# Patient Record
Sex: Female | Born: 2000 | Hispanic: No | Marital: Single | Smoking: Former smoker
Health system: Southern US, Community
[De-identification: ages and names within clinical notes are randomized; demographics above are authoritative.]

---
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
## 2020-01-20 ENCOUNTER — Telehealth: Payer: Medicaid Other | Admitting: Internal Medicine

## 2020-07-03 IMAGING — CR LEFT KNEE - COMPLETE 4+ VIEW
4 series · 4 of 4 positions shown · non-contrast
Comparison: None.

CLINICAL DATA: Recent popping sensation in the knee, initial
encounter

EXAM:
LEFT KNEE - COMPLETE 4+ VIEW

[knee ap]
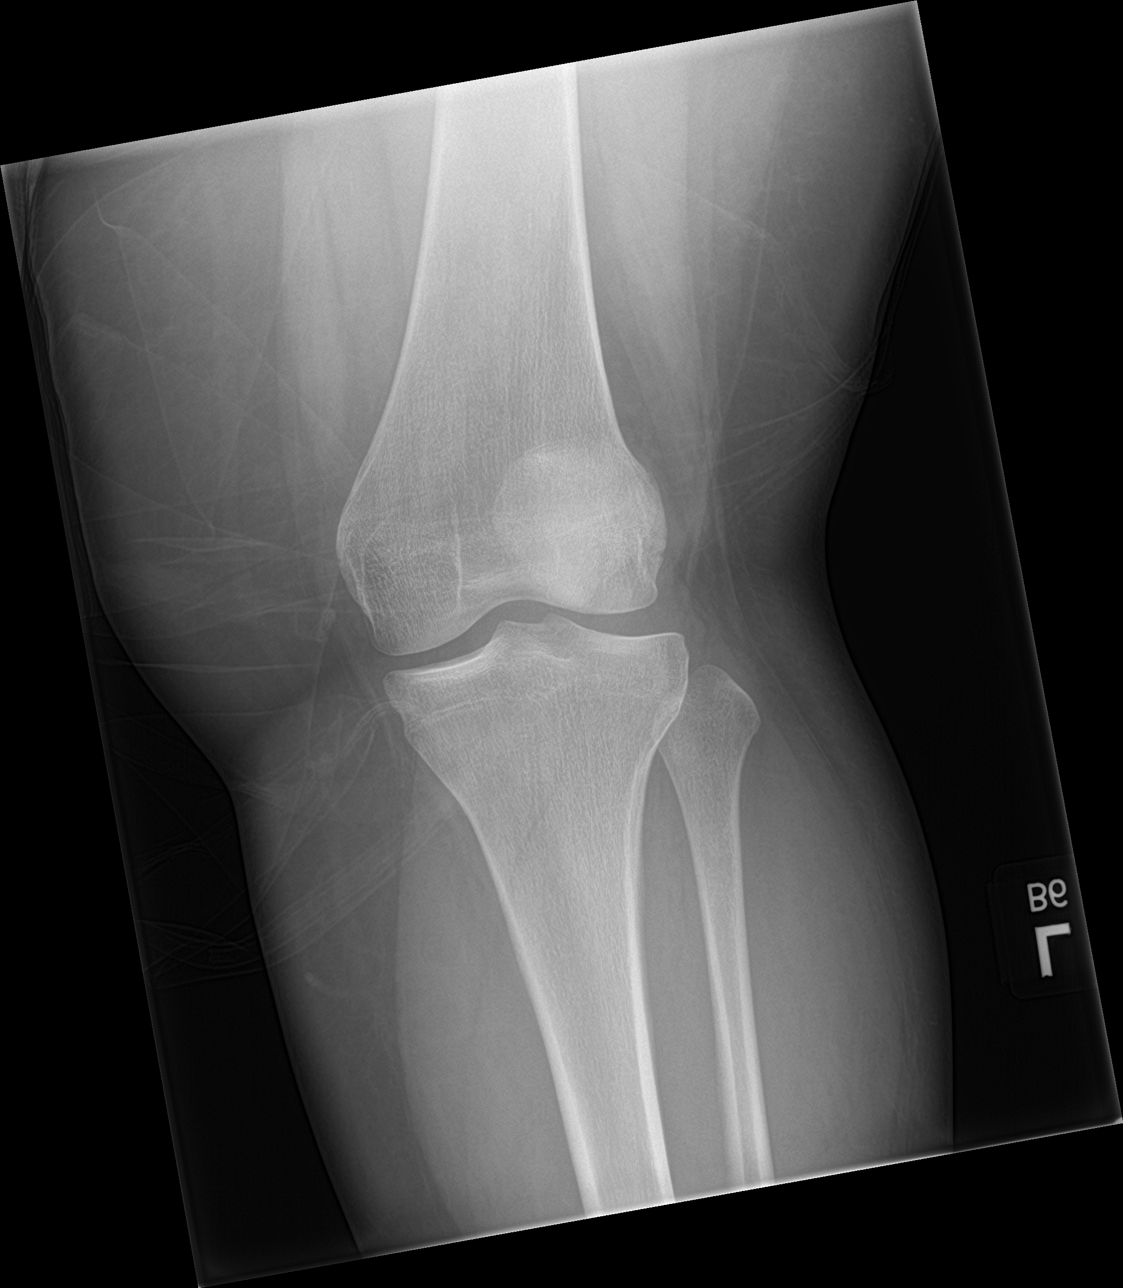

[knee obl]
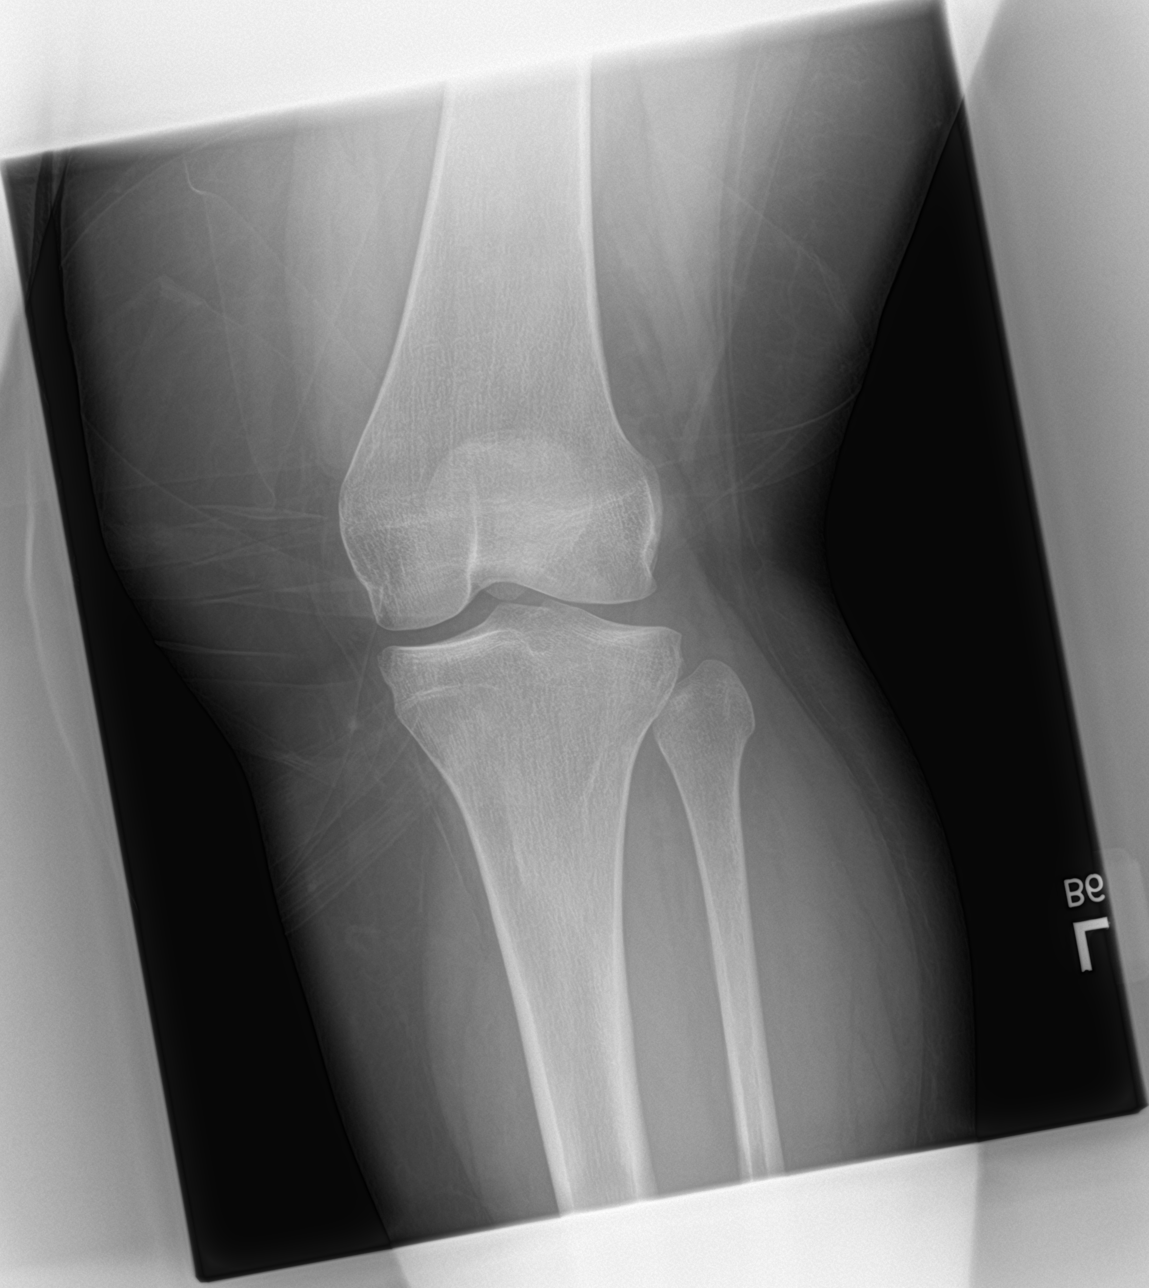

[knee lat]
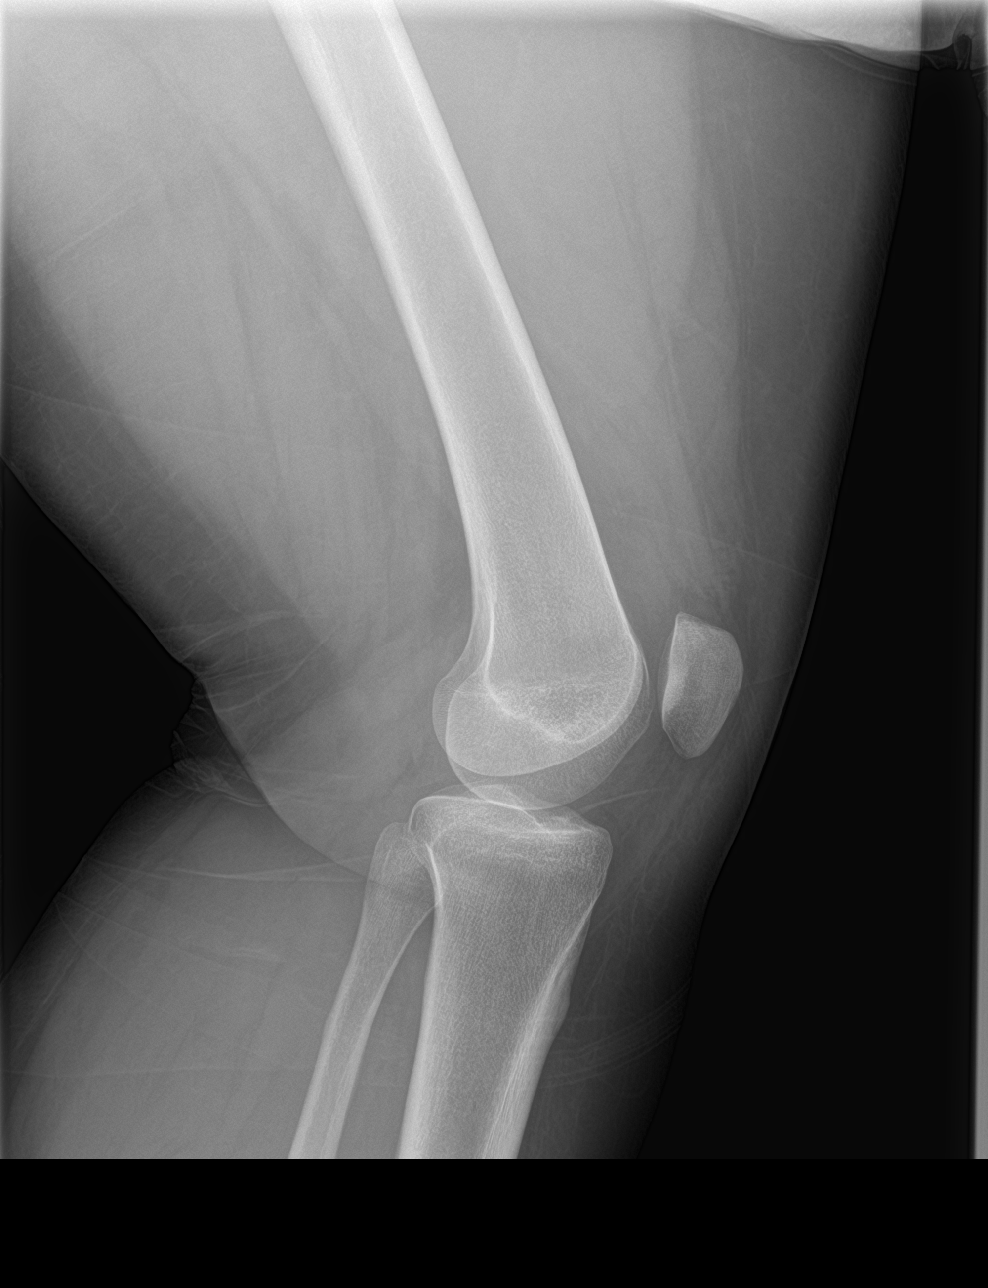

[knee sunrise]
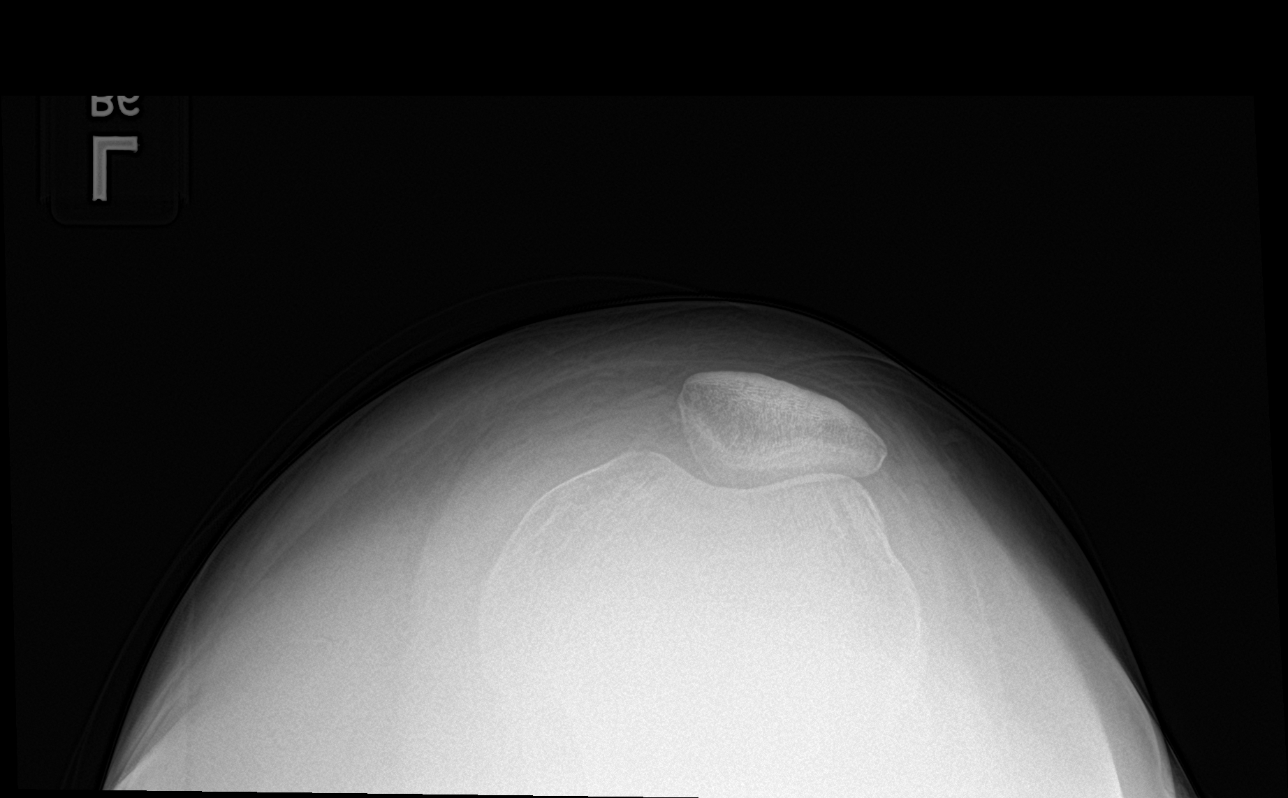

[4 of 4 positions shown; findings below may reference images not displayed]

FINDINGS: No evidence of fracture, dislocation, or joint effusion. No evidence
of arthropathy or other focal bone abnormality. Soft tissues are
unremarkable.
IMPRESSION: No acute abnormality noted.
